# Patient Record
Sex: Female | Born: 1995 | Race: Black or African American | Hispanic: No | Marital: Single | Smoking: Never smoker
Health system: Southern US, Community
[De-identification: ages and names within clinical notes are randomized; demographics above are authoritative.]

## PROBLEM LIST (undated history)

## (undated) DIAGNOSIS — Z789 Other specified health status: Secondary | ICD-10-CM

## (undated) HISTORY — PX: NO PAST SURGERIES: SHX2092

---
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
## 2018-01-09 ENCOUNTER — Emergency Department (HOSPITAL_BASED_OUTPATIENT_CLINIC_OR_DEPARTMENT_OTHER): Payer: Medicaid Other

## 2018-01-09 ENCOUNTER — Other Ambulatory Visit: Payer: Self-pay

## 2018-01-09 ENCOUNTER — Emergency Department (HOSPITAL_BASED_OUTPATIENT_CLINIC_OR_DEPARTMENT_OTHER)
Admission: EM | Admit: 2018-01-09 | Discharge: 2018-01-09 | Disposition: A | Discharge status: Home / Self Care | Payer: Medicaid Other | Attending: Emergency Medicine | Admitting: Emergency Medicine

## 2018-01-09 ENCOUNTER — Encounter (HOSPITAL_BASED_OUTPATIENT_CLINIC_OR_DEPARTMENT_OTHER): Payer: Self-pay | Admitting: *Deleted

## 2018-01-09 DIAGNOSIS — Y939 Activity, unspecified: Secondary | ICD-10-CM | POA: Diagnosis not present

## 2018-01-09 DIAGNOSIS — Y999 Unspecified external cause status: Secondary | ICD-10-CM | POA: Insufficient documentation

## 2018-01-09 DIAGNOSIS — Z79899 Other long term (current) drug therapy: Secondary | ICD-10-CM | POA: Insufficient documentation

## 2018-01-09 DIAGNOSIS — S63601A Unspecified sprain of right thumb, initial encounter: Secondary | ICD-10-CM | POA: Insufficient documentation

## 2018-01-09 DIAGNOSIS — X501XXA Overexertion from prolonged static or awkward postures, initial encounter: Secondary | ICD-10-CM | POA: Diagnosis not present

## 2018-01-09 DIAGNOSIS — Y929 Unspecified place or not applicable: Secondary | ICD-10-CM | POA: Insufficient documentation

## 2018-01-09 DIAGNOSIS — S6991XA Unspecified injury of right wrist, hand and finger(s), initial encounter: Secondary | ICD-10-CM | POA: Diagnosis present

## 2018-01-09 NOTE — Discharge Instructions (Signed)
Please read and follow all provided instructions.  Your diagnoses today include:  1. Sprain of right thumb, unspecified site of finger, initial encounter    Tests performed today include:  An x-ray of the affected area - does NOT show any broken bones  Vital signs. See below for your results today.   Medications prescribed:   Ibuprofen (Motrin, Advil) - anti-inflammatory pain medication  Do not exceed  ibuprofen every 6 hours, take with food  You have been prescribed an anti-inflammatory medication or NSAID. Take with food. Take smallest effective dose for the shortest duration needed for your pain. Stop taking if you experience stomach pain or vomiting.   Take any prescribed medications only as directed.  Home care instructions:   Follow any educational materials contained in this packet  Follow R.I.C.E. Protocol:  R - rest your injury   I  - use ice on injury without applying directly to skin  C - compress injury with bandage or splint  E - elevate the injury as much as possible  Follow-up instructions: Please follow-up with your primary care provider or the provided orthopedic physician (bone specialist) if you continue to have significant pain in 1 week. In this case you may have a more severe injury that requires further care.   Return instructions:   Please return if your fingers are numb or tingling, appear gray or blue, or you have severe pain (also elevate the arm and loosen splint or wrap if you were given one)  Please return to the Emergency Department if you experience worsening symptoms.   Please return if you have any other emergent concerns.  Additional Information:  Your vital signs today were: BP 117/84 (BP Location: Left Arm)    Pulse 79    Temp 97.9 F (36.6 C) (Oral)    Resp 17    Ht  (1.702 m)    Wt 64.9 kg (143 lb)    LMP 12/26/2017    SpO2 100%    BMI 22.40 kg/m  If your blood pressure (BP) was elevated above 135/85 this visit,  please have this repeated by your doctor within one month. --------------

## 2018-01-09 NOTE — ED Provider Notes (Signed)
[facility] EMERGENCY DEPARTMENT Provider Note   CSN: [id number] Arrival date & time: 01/09/18  1623     History   Chief Complaint Chief Complaint  Patient presents with  . Finger Injury    HPI Kristina Ford is a 22 y.o. female.  Patient presents with acute onset of right thumb injury occurring yesterday when her niece fell onto her thumb causing it to bend in an awkward way.  Patient has had pain, decreased grip strength of the thumb since that time.  No numbness or tingling.  No treatments prior to arrival.  Pain is worse with movement and palpation.  No other injuries reported.     Past Medical History:  Diagnosis Date  . Medical history non-contributory     Patient Active Problem List   Diagnosis Date Noted  . Active labor at term 11/18/2014  . Decreased fetal movement   . [redacted] weeks gestation of pregnancy   . Fetal cardiac echogenic focus, antepartum 07/15/2014    Past Surgical History:  Procedure Laterality Date  . NO PAST SURGERIES       OB History    Gravida  2   Para  1   Term  1   Preterm      AB  1   Living  1     SAB  1   TAB      Ectopic      Multiple  0   Live Births  1            Home Medications    Prior to Admission medications   Medication Sig Start Date End Date Taking? Authorizing Provider  cyclobenzaprine (FLEXERIL) 10 MG tablet Take 1 tablet (10 mg total) by mouth at bedtime. 11/21/14   Bluford Main, MD  ibuprofen (ADVIL,MOTRIN) 600 MG tablet Take 1 tablet (600 mg total) by mouth every 6 (six) hours. 11/21/14   Bluford Main, MD  Prenatal Vit-Fe Fumarate-FA (PRENATAL COMPLETE) 14-0.4 MG TABS Take 1 tablet by mouth daily. 04/07/14   Trixie Dredge, PA-C    Family History No family history on file.  Social History Social History   Tobacco Use  . Smoking status: Never Smoker  . Smokeless tobacco: Never Used  Substance Use Topics  . Alcohol use: No  . Drug use: Yes    Types: Marijuana     Allergies     Patient has no known allergies.   Review of Systems Review of Systems  Constitutional: Negative for activity change.  Musculoskeletal: Positive for arthralgias. Negative for back pain, joint swelling and neck pain.  Skin: Negative for wound.  Neurological: Positive for weakness. Negative for numbness.     Physical Exam Updated Vital Signs BP 117/84 (BP Location: Left Arm)   Pulse 79   Temp 97.9 F (36.6 C) (Oral)   Resp 17   Ht  (1.702 m)   Wt 64.9 kg (143 lb)   LMP 12/26/2017   SpO2 100%   BMI 22.40 kg/m   Physical Exam  Constitutional: She appears well-developed and well-nourished.  HENT:  Head: Normocephalic and atraumatic.  Eyes: Pupils are equal, round, and reactive to light.  Neck: Normal range of motion. Neck supple.  Cardiovascular: Normal pulses. Exam reveals no decreased pulses.  Musculoskeletal: She exhibits tenderness. She exhibits no edema.       Right hand: She exhibits decreased range of motion and tenderness (Ulnar and radial). She exhibits normal capillary refill and no deformity.  Hands: Neurological: She is alert. No sensory deficit.  Motor, sensation, and vascular distal to the injury is fully intact.   Skin: Skin is warm and dry.  Psychiatric: She has a normal mood and affect.  Nursing note and vitals reviewed.    ED Treatments / Results  Labs (all labs ordered are listed, but only abnormal results are displayed) Labs Reviewed - No data to display  EKG None  Radiology Dg Finger Thumb Right  Result Date: 01/09/2018 CLINICAL DATA:  Rt Thumb pain entirely s/p bent backwards after a child fell on it x yesterday with limited ROM and grip. No old injury known. EXAM: RIGHT THUMB 2+V COMPARISON:  None. FINDINGS: There is no evidence of fracture or dislocation. There is no evidence of arthropathy or other focal bone abnormality. Soft tissues are unremarkable IMPRESSION: Negative. Electronically Signed   By: Norva Pavlov M.D.   On:  01/09/2018 17:28    Procedures Procedures (including critical care time)  Medications Ordered in ED Medications - No data to display   Initial Impression / Assessment and Plan / ED Course  I have reviewed the triage vital signs and the nursing notes.  Pertinent labs & imaging results that were available during my care of the patient were reviewed by me and considered in my medical decision making (see chart for details).     Patient seen and examined.  Reviewed x-ray results with patient.  Vital signs reviewed and are as follows: BP 117/84 (BP Location: Left Arm)   Pulse 79   Temp 97.9 F (36.6 C) (Oral)   Resp 17   Ht  (1.702 m)   Wt 64.9 kg (143 lb)   LMP 12/26/2017   SpO2 100%   BMI 22.40 kg/m   Will provide Velcro thumb spica.  Encouraged sports medicine follow-up if needed after trial of rice protocol.  Final Clinical Impressions(s) / ED Diagnoses   Final diagnoses:  Sprain of right thumb, unspecified site of finger, initial encounter   Patient with thumb sprain, possible UCL injury.  Imaging negative.  Treatment plan as above.  Thumb is neurovascularly intact.  ED Discharge Orders    None       Renne Crigler, Cordelia Poche 01/09/18 1830    Tegeler, Canary Brim, MD 01/10/18 [phone]

## 2018-01-09 NOTE — ED Triage Notes (Signed)
A child fell onto her right thumb yesterday causing it to move backward. Painful today.

## 2019-01-01 ENCOUNTER — Emergency Department (HOSPITAL_BASED_OUTPATIENT_CLINIC_OR_DEPARTMENT_OTHER)
Admission: EM | Admit: 2019-01-01 | Discharge: 2019-01-01 | Disposition: A | Discharge status: Home / Self Care | Payer: Medicaid Other | Attending: Emergency Medicine | Admitting: Emergency Medicine

## 2019-01-01 ENCOUNTER — Other Ambulatory Visit: Payer: Self-pay

## 2019-01-01 ENCOUNTER — Encounter (HOSPITAL_BASED_OUTPATIENT_CLINIC_OR_DEPARTMENT_OTHER): Payer: Self-pay | Admitting: Emergency Medicine

## 2019-01-01 DIAGNOSIS — Z79899 Other long term (current) drug therapy: Secondary | ICD-10-CM | POA: Insufficient documentation

## 2019-01-01 DIAGNOSIS — N898 Other specified noninflammatory disorders of vagina: Secondary | ICD-10-CM | POA: Diagnosis present

## 2019-01-01 DIAGNOSIS — A599 Trichomoniasis, unspecified: Secondary | ICD-10-CM

## 2019-01-01 DIAGNOSIS — N760 Acute vaginitis: Secondary | ICD-10-CM | POA: Insufficient documentation

## 2019-01-01 DIAGNOSIS — Z113 Encounter for screening for infections with a predominantly sexual mode of transmission: Secondary | ICD-10-CM | POA: Diagnosis not present

## 2019-01-01 DIAGNOSIS — B9689 Other specified bacterial agents as the cause of diseases classified elsewhere: Secondary | ICD-10-CM

## 2019-01-01 LAB — WET PREP, GENITAL
Sperm: NONE SEEN
Yeast Wet Prep HPF POC: NONE SEEN

## 2019-01-01 LAB — URINALYSIS, ROUTINE W REFLEX MICROSCOPIC
Bilirubin Urine: NEGATIVE
Glucose, UA: NEGATIVE mg/dL
Hgb urine dipstick: NEGATIVE
Ketones, ur: NEGATIVE mg/dL
Leukocytes,Ua: NEGATIVE
Nitrite: NEGATIVE
Protein, ur: NEGATIVE mg/dL
Specific Gravity, Urine: 1.02 (ref 1.005–1.030)
pH: 7 (ref 5.0–8.0)

## 2019-01-01 LAB — PREGNANCY, URINE: Preg Test, Ur: NEGATIVE

## 2019-01-01 MED ORDER — CEFTRIAXONE SODIUM 250 MG IJ SOLR
250.0000 mg | Freq: Once | INTRAMUSCULAR | Status: AC
  Administered 2019-01-01: 250 mg via INTRAMUSCULAR
  Filled 2019-01-01: qty 250

## 2019-01-01 MED ORDER — METRONIDAZOLE 500 MG PO TABS: 500.0000 mg | ORAL_TABLET | Freq: Two times a day (BID) | ORAL | 0 refills | Status: DC

## 2019-01-01 MED ORDER — AZITHROMYCIN 1 G PO PACK
1.0000 g | PACK | Freq: Once | ORAL | Status: AC
  Administered 2019-01-01: 06:00:00 1 g via ORAL
  Filled 2019-01-01: qty 1

## 2019-01-01 MED ORDER — METRONIDAZOLE 500 MG PO TABS
2000.0000 mg | ORAL_TABLET | Freq: Once | ORAL | Status: AC
  Administered 2019-01-01: 2000 mg via ORAL
  Filled 2019-01-01: qty 4

## 2019-01-01 MED ORDER — ONDANSETRON 4 MG PO TBDP
4.0000 mg | ORAL_TABLET | Freq: Once | ORAL | Status: AC
  Administered 2019-01-01: 06:00:00 4 mg via ORAL
  Filled 2019-01-01: qty 1

## 2019-01-01 NOTE — ED Provider Notes (Signed)
TIME SEEN: 5:24 AM  CHIEF COMPLAINT: Vaginal discharge  HPI: Patient is a G2, P1 with no significant past medical history who presents to the emergency department with a month of vaginal discharge.  She denies abdominal pain, fevers, cough, chest pain, shortness of breath, vomiting, diarrhea, dysuria, hematuria, vaginal bleeding.  Last menstrual period was 2 weeks ago.  Sexually active with one new female partner and they do not use protection.  She states she has never had an STD before.  She states that it is not abnormal for her to have vaginal discharge even since she was a teenager.  She is requesting STD testing today.  ROS: See HPI Constitutional: no fever  Eyes: no drainage  ENT: no runny nose   Cardiovascular:  no chest pain  Resp: no SOB  GI: no vomiting GU: no dysuria Integumentary: no rash  Allergy: no hives  Musculoskeletal: no leg swelling  Neurological: no slurred speech ROS otherwise negative  PAST MEDICAL HISTORY/PAST SURGICAL HISTORY:  Past Medical History:  Diagnosis Date  . Medical history non-contributory     MEDICATIONS:  Prior to Admission medications   Medication Sig Start Date End Date Taking? Authorizing Provider  cyclobenzaprine (FLEXERIL) 10 MG tablet Take 1 tablet (10 mg total) by mouth at bedtime. 11/21/14   Bluford Main, MD  ibuprofen (ADVIL,MOTRIN) 600 MG tablet Take 1 tablet (600 mg total) by mouth every 6 (six) hours. 11/21/14   Bluford Main, MD  Prenatal Vit-Fe Fumarate-FA (PRENATAL COMPLETE) 14-0.4 MG TABS Take 1 tablet by mouth daily. 04/07/14   Trixie Dredge, PA-C    ALLERGIES:  No Known Allergies  SOCIAL HISTORY:  Social History   Tobacco Use  . Smoking status: Never Smoker  . Smokeless tobacco: Never Used  Substance Use Topics  . Alcohol use: No    FAMILY HISTORY: No family history on file.  EXAM: BP 130/80 (BP Location: Right Arm)   Pulse 85   Temp 98.2 F (36.8 C) (Oral)   Resp 16   Ht 5\' 5"  (1.651 m)   Wt 65 kg   LMP  12/18/2018   SpO2 100%   BMI 23.85 kg/m  CONSTITUTIONAL: Alert and oriented and responds appropriately to questions. Well-appearing; well-nourished HEAD: Normocephalic EYES: Conjunctivae clear, pupils appear equal, EOMI ENT: normal nose; moist mucous membranes NECK: Normal range of motion CARD: RRR RESP: Normal chest excursion without splinting or tachypnea; no hypoxia or respiratory distress, speaking full sentences ABD/GI: Normal bowel sounds; non-distended; soft, non-tender, no rebound, no guarding, no peritoneal signs, no hepatosplenomegaly GU:  Normal external genitalia. No lesions, rashes noted. Patient has no vaginal bleeding on exam.  Minimal thick white vaginal discharge.  No adnexal tenderness, mass or fullness, no cervical motion tenderness. Cervix is not appear friable.  Cervix is closed.  Chaperone present for exam. BACK:  The back appears normal and is non-tender to palpation, there is no CVA tenderness EXT: Normal ROM in all joints; no major deformity noted SKIN: Normal color for age and race; warm; no rash on exposed skin NEURO: Moves all extremities equally, normal gait, normal speech PSYCH: The patient's mood and manner are appropriate. Grooming and personal hygiene are appropriate.  MEDICAL DECISION MAKING: Patient here with vaginal discharge.  Requesting STD screening.  Will send urinalysis, urine pregnancy test and obtain gonorrhea, chlamydia swabs as well as a wet prep.  Have offered HIV and syphilis testing which she declines.  Have recommended this be done as an outpatient.  Abdominal exam benign.  Pelvic exam benign.  Doubt torsion, PID, TOA, ovarian cyst, appendicitis.  ED PROGRESS: Urine shows no sign of infection.  Pregnancy test negative.  Offered empiric treatment for gonorrhea and chlamydia given patient presented with her partner who also is complaining of penile discharge.  She declines empiric treatment and would like to wait for her test results to come back.   She is aware that she needs to avoid sexual intercourse for at least 7 days after her partner has been treated and if she is positive she will need to be treated and wait an additional 7 days after treatment before intercourse.  Urine shows no sign of infection and her pregnancy test is negative.  Her wet prep is positive for Trichomonas as well as clue cells.  Have recommended that she be empirically treated today for gonorrhea, chlamydia as well as receive treatment for trichomonas and she will be discharged with Flagyl.  Have advised her to avoid sexual intercourse for at least a week after treatment has been completed.  She is very upset, tearful.  I have again offered her HIV and syphilis testing today which she declines.  We will give her outpatient health department follow-up information for further STD screening.  She verbalized understanding.   At this time, I do not feel there is any life-threatening condition present. I have reviewed and discussed all results (EKG, imaging, lab, urine as appropriate) and exam findings with patient/family. I have reviewed nursing notes and appropriate previous records.  I feel the patient is safe to be discharged home without further emergent workup and can continue workup as an outpatient as needed. Discussed usual and customary return precautions. Patient/family verbalize understanding and are comfortable with this plan.  Outpatient follow-up has been provided as needed. All questions have been answered.    Nicholaus Steinke, Layla MawKristen N, DO 01/01/19 [id number]

## 2019-01-01 NOTE — ED Triage Notes (Signed)
Patient co vaginal discharge; states seen at gyn for same 1 month ago.

## 2019-01-01 NOTE — Discharge Instructions (Addendum)
I strongly recommend that you follow-up with the health department for HIV and syphilis screening.  You have received treatment today for trichomonas, gonorrhea and chlamydia today.  Your trichomonas test was positive.  Your gonorrhea and Chlamydia tests are pending.  I recommend you avoid sexual intercourse for at least 1 week after your treatment has completed.  You were also diagnosed with bacterial vaginosis which can cause vaginal discharge.  This is not a sexually transmitted disease.  You need to take metronidazole (Flagyl) twice a day for 1 week.  Do not drink alcohol when taking this medication.   [place] [place] [facility]:  [facility] [employer] [facility] [employer] [address] [place], [place] [phone]  [facility] for [facility] at [facility] [address] [place], [place] [phone]  [facility] [address] [place], [place] [phone]   [facility] [address] [place] [address] [place], [place] [phone]  [facility] [address] [place], [place] [phone]   [facility] [address] [place] [address] [place], [place] [phone]   [facility] [address] [place], [place] [phone]   [facility] [address] [place], [place] [phone]   [facility] [address] [place], [place] [phone]

## 2019-01-02 LAB — GC/CHLAMYDIA PROBE AMP (~~LOC~~) NOT AT ARMC
Chlamydia: NEGATIVE
Neisseria Gonorrhea: NEGATIVE

## 2019-01-18 ENCOUNTER — Telehealth (HOSPITAL_BASED_OUTPATIENT_CLINIC_OR_DEPARTMENT_OTHER): Payer: Self-pay | Admitting: Emergency Medicine

## 2019-01-18 MED ORDER — METRONIDAZOLE 500 MG PO TABS: 500.0000 mg | ORAL_TABLET | Freq: Two times a day (BID) | ORAL | 0 refills | Status: DC

## 2019-01-18 NOTE — Telephone Encounter (Signed)
Patient states she lost her prescription for Flagyl and is requesting a replacement.

## 2020-07-08 ENCOUNTER — Emergency Department (HOSPITAL_BASED_OUTPATIENT_CLINIC_OR_DEPARTMENT_OTHER)
Admission: EM | Admit: 2020-07-08 | Discharge: 2020-07-08 | Disposition: A | Discharge status: Home / Self Care | Payer: Medicaid Other | Attending: Emergency Medicine | Admitting: Emergency Medicine

## 2020-07-08 ENCOUNTER — Encounter (HOSPITAL_BASED_OUTPATIENT_CLINIC_OR_DEPARTMENT_OTHER): Payer: Self-pay | Admitting: *Deleted

## 2020-07-08 ENCOUNTER — Emergency Department (HOSPITAL_BASED_OUTPATIENT_CLINIC_OR_DEPARTMENT_OTHER): Payer: Medicaid Other

## 2020-07-08 ENCOUNTER — Other Ambulatory Visit: Payer: Self-pay

## 2020-07-08 DIAGNOSIS — M542 Cervicalgia: Secondary | ICD-10-CM | POA: Diagnosis not present

## 2020-07-08 DIAGNOSIS — S01512A Laceration without foreign body of oral cavity, initial encounter: Secondary | ICD-10-CM | POA: Insufficient documentation

## 2020-07-08 DIAGNOSIS — S0081XA Abrasion of other part of head, initial encounter: Secondary | ICD-10-CM

## 2020-07-08 DIAGNOSIS — Y92481 Parking lot as the place of occurrence of the external cause: Secondary | ICD-10-CM | POA: Diagnosis not present

## 2020-07-08 DIAGNOSIS — M7918 Myalgia, other site: Secondary | ICD-10-CM

## 2020-07-08 DIAGNOSIS — M545 Low back pain, unspecified: Secondary | ICD-10-CM | POA: Insufficient documentation

## 2020-07-08 DIAGNOSIS — S0990XA Unspecified injury of head, initial encounter: Secondary | ICD-10-CM | POA: Diagnosis present

## 2020-07-08 LAB — PREGNANCY, URINE: Preg Test, Ur: NEGATIVE

## 2020-07-08 MED ORDER — METHOCARBAMOL 500 MG PO TABS: 1000.0000 mg | ORAL_TABLET | Freq: Four times a day (QID) | ORAL | 0 refills | Status: AC

## 2020-07-08 MED ORDER — ONDANSETRON 4 MG PO TBDP
4.0000 mg | ORAL_TABLET | Freq: Once | ORAL | Status: AC
  Administered 2020-07-08: 4 mg via ORAL

## 2020-07-08 MED ORDER — IBUPROFEN 400 MG PO TABS
600.0000 mg | ORAL_TABLET | Freq: Once | ORAL | Status: AC
  Administered 2020-07-08: 600 mg via ORAL
  Filled 2020-07-08: qty 1

## 2020-07-08 MED ORDER — ONDANSETRON HCL 4 MG PO TABS
4.0000 mg | ORAL_TABLET | Freq: Once | ORAL | Status: DC
  Filled 2020-07-08: qty 1

## 2020-07-08 MED ORDER — ONDANSETRON HCL 4 MG PO TABS: 4.0000 mg | ORAL_TABLET | Freq: Four times a day (QID) | ORAL | 0 refills | Status: AC

## 2020-07-08 MED ORDER — NAPROXEN 500 MG PO TABS: 500.0000 mg | ORAL_TABLET | Freq: Two times a day (BID) | ORAL | 0 refills | Status: AC

## 2020-07-08 NOTE — ED Notes (Signed)
Explained f/u info to pt friend, verbalized understanding of where to get dc meds. Vitals WNL, assisted pt out of ED via w/c, A&OX4. Left ED w/o issue.

## 2020-07-08 NOTE — ED Notes (Signed)
Patient transported to X-ray 
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
## 2021-10-18 ENCOUNTER — Encounter: Payer: Medicaid Other | Admitting: Obstetrics and Gynecology
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
## 2022-10-31 ENCOUNTER — Encounter: Payer: Self-pay | Admitting: General Practice

## 2022-11-15 ENCOUNTER — Ambulatory Visit: Payer: Medicaid Other | Admitting: Family Medicine

## 2022-11-23 ENCOUNTER — Encounter: Payer: Medicaid Other | Admitting: Obstetrics and Gynecology

## 2023-06-18 ENCOUNTER — Ambulatory Visit: Payer: Medicaid Other

## 2023-06-18 ENCOUNTER — Other Ambulatory Visit (HOSPITAL_COMMUNITY)
Admission: RE | Admit: 2023-06-18 | Discharge: 2023-06-18 | Disposition: A | Discharge status: Home / Self Care | Payer: Medicaid Other | Source: Ambulatory Visit

## 2023-06-18 VITALS — BP 103/65 | HR 66 | Ht 67.0 in | Wt 123.0 lb

## 2023-06-18 DIAGNOSIS — Z1331 Encounter for screening for depression: Secondary | ICD-10-CM

## 2023-06-18 DIAGNOSIS — Z1339 Encounter for screening examination for other mental health and behavioral disorders: Secondary | ICD-10-CM

## 2023-06-18 DIAGNOSIS — Z124 Encounter for screening for malignant neoplasm of cervix: Secondary | ICD-10-CM | POA: Diagnosis not present

## 2023-06-18 DIAGNOSIS — Z30432 Encounter for removal of intrauterine contraceptive device: Secondary | ICD-10-CM | POA: Diagnosis not present

## 2023-06-18 DIAGNOSIS — Z113 Encounter for screening for infections with a predominantly sexual mode of transmission: Secondary | ICD-10-CM | POA: Insufficient documentation

## 2023-06-18 DIAGNOSIS — Z01419 Encounter for gynecological examination (general) (routine) without abnormal findings: Secondary | ICD-10-CM | POA: Insufficient documentation

## 2023-06-18 DIAGNOSIS — Z1239 Encounter for other screening for malignant neoplasm of breast: Secondary | ICD-10-CM | POA: Diagnosis not present

## 2023-06-18 DIAGNOSIS — B9689 Other specified bacterial agents as the cause of diseases classified elsewhere: Secondary | ICD-10-CM

## 2023-06-18 DIAGNOSIS — N760 Acute vaginitis: Secondary | ICD-10-CM
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
## 2023-06-18 NOTE — Progress Notes (Signed)
    GYNECOLOGY OFFICE PROCEDURE NOTE  Kristina Ford is a 27 y.o. G2P1011 here for Mirena IUD removal. No GYN concerns.  Last pap smear was on completed today and pending.  IUD Removal  Patient identified, informed consent performed, consent signed.  Patient was in the dorsal lithotomy position, normal external genitalia was noted.  A speculum was placed in the patient's vagina, normal discharge was noted, no lesions. The cervix was visualized, no lesions, no abnormal discharge.  The strings of the IUD were grasped and pulled using ring forceps. The IUD was removed in its entirety. Patient tolerated the procedure well.    Patient will use no method for contraception.  Routine preventative health maintenance measures emphasized.   Cherre Robins MSN, CNM Advanced Practice Provider, Center for [employer]

## 2023-06-20 LAB — CYTOLOGY - PAP
Chlamydia: NEGATIVE
Comment: NEGATIVE
Comment: NEGATIVE
Comment: NORMAL
Diagnosis: NEGATIVE
Neisseria Gonorrhea: NEGATIVE
Trichomonas: NEGATIVE

## 2023-06-21 MED ORDER — METRONIDAZOLE 500 MG PO TABS: 500.0000 mg | ORAL_TABLET | Freq: Two times a day (BID) | ORAL | 0 refills | Status: AC

## 2023-06-21 NOTE — Addendum Note (Signed)
Addended by: Gerrit Heck L on: 06/21/2023 08:33 AM   Modules accepted: Orders

## 2023-06-27 ENCOUNTER — Telehealth: Payer: Self-pay | Admitting: Clinical

## 2023-06-27 NOTE — Telephone Encounter (Signed)
Attempt call regarding referral; Unable to leave voice message as no voicemail set up.
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
## 2023-07-23 ENCOUNTER — Ambulatory Visit (INDEPENDENT_AMBULATORY_CARE_PROVIDER_SITE_OTHER): Payer: Medicaid Other | Admitting: Clinical

## 2023-07-23 DIAGNOSIS — F819 Developmental disorder of scholastic skills, unspecified: Secondary | ICD-10-CM

## 2023-07-23 DIAGNOSIS — F39 Unspecified mood [affective] disorder: Secondary | ICD-10-CM

## 2023-07-24 NOTE — Patient Instructions (Addendum)
Center for [facility] at [facility] [address] [place], [place] [address] [phone] (main office) [phone] [facility] office)  [facility] [address] [place], [place]  [address]  ADHD testing Learning disorder testing Psychological evaluations  [email or web address] [phone]  Housing Resources                    [address] [place] (serves [place], [place], [place], [place], [place], [place], [place], [place], [place], [place], [place], [place], and [place] counties) [address] [place], [place] [address] [phone] DeveloperU.ch  **Rental assistance, Home Rehabilitation,Weatherization Assistance Program, Chief Financial Officer, Housing Voucher Program  [employer] for Housing and [employer] Studies: Proofreader Resources to residents of [place], [place], and [place] Make sure you have your documents ready, including:  (Household income verification: 2 months pay stubs, unemployment/social security award letter, statement of no income for all household members over 83) Photo ID for all household members over 18 Utility Bill/Rent Ledger/Lease: must show past due amount for utilities/rent, or the rental agreement if rent is current 2. Start your application online or by paper (in [place] or [place]) at:     [email or web address]  3. Once you have completed the online application, you will get an email confirmation message from the county. Expect to hear back by phone or email at least 6-10 weeks from submitting your application.  4. While you wait:  Call [phone] to check in on your application Let your landlord know that you've applied. Your landlord will be asked to submit documents (W-9) during this application process. Payments will be made directly to the landlord/property  management company and utility company Rent or utility assistance for [employer], [place], and [place] Apply at [email or web address] Questions? Call or email Kristina Ford at [phone] or [email or web address]   Eviction Mediation Program: The HOPE Program [email or web address] HOPE Progam serves low-income renters in [address] [place] counties, defined as less than or equal to 80% of the area median income for the county where the renter lives. In the following 12 counties, you should apply to your local rent and utility assistance program INSTEAD OF the HOPE Program: [place], [place], [place], [place], [place], [place], [place], [place], [place], [place], [place], [place]  If you live outside of [place], contact HOPE call center at [phone] to talk to a Program Representative Monday-Friday, 8am-5pm Note that Native American tribes also received federal funding for rent and utility assistance programs. Recognized members of the following tribes will be served by programs managed by tribal governments, including: [place], [place], [place], [place] of [place] and [facility] Eviction Mediation Coordinator, [place], [phone] [email or web address] .[employer], [place], [phone] [email or web address]   Housing Resources [facility] [place] [address], [place], [place] [address] [phone] www.gha-[email or web address].[email or web address] [address] [place], [place] [address] [phone] PhoneCaptions.ch **Programs include: Hospital doctor and Housing Counseling, Healthy Radiographer, therapeutic, Homeless Prevention and Housing Assistance  Government [facility] [address], [place], [place] [address] [phone] [email or web address].[email or web address] **housing applications/recertification; tax  payment relief/exemption under specific qualifications  [facility] [address] [place], [place] [address] [phone] [email or web address]  **emergency and transitional housing, rent/mortgage assistance, utility assistance  [facility] [address] [place], [place] [address] [phone] [email or web address] **emergency and transitional housing  [employer] for [employer] [address], [place], [place]  [address] [phone] [email or web address]

## 2023-08-12 NOTE — BH Specialist Note (Unsigned)
Integrated Behavioral Health via Telemedicine Visit  08/12/2023 Eriyana Hanauer [id number]  Number of Integrated Behavioral Health Clinician visits: 1- Initial Visit  Session Start time: 1050   Session End time: 1219  Total time in minutes: 89   Referring Provider: *** Patient/Family location: [facility] Provider location: *** All persons participating in visit: *** Types of Service: {CHL AMB TYPE OF SERVICE:[phone]}  I connected with [name] and/or Fatimah Lounsbury's {family members:20773} via  Telephone or Engineer, civil (consulting)  (Video is Surveyor, mining) and verified that I am speaking with the correct person using two identifiers. Discussed confidentiality: {YES/NO:21197}  I discussed the limitations of telemedicine and the availability of in person appointments.  Discussed there is a possibility of technology failure and discussed alternative modes of communication if that failure occurs.  I discussed that engaging in this telemedicine visit, they consent to the provision of behavioral healthcare and the services will be billed under their insurance.  Patient and/or legal guardian expressed understanding and consented to Telemedicine visit: {YES/NO:21197}  Presenting Concerns: Patient and/or family reports the following symptoms/concerns: *** Duration of problem: ***; Severity of problem: {Mild/Moderate/Severe:20260}  Patient and/or Family's Strengths/Protective Factors: {CHL AMB BH PROTECTIVE FACTORS:[phone]}  Goals Addressed: Patient will:  Reduce symptoms of: {IBH Symptoms:21014056}   Increase knowledge and/or ability of: {IBH Patient Tools:21014057}   Demonstrate ability to: {IBH Goals:21014053}  Progress towards Goals: {CHL AMB BH PROGRESS TOWARDS GOALS:[phone]}  Interventions: Interventions utilized:  {IBH Interventions:21014054} Standardized Assessments completed: {IBH Screening Tools:21014051}  Patient and/or Family Response:  ***  Assessment: Patient currently experiencing ***.   Patient may benefit from ***.  Plan: Follow up with behavioral health clinician on : *** Behavioral recommendations: *** Referral(s): {IBH Referrals:21014055}  I discussed the assessment and treatment plan with the patient and/or parent/guardian. They were provided an opportunity to ask questions and all were answered. They agreed with the plan and demonstrated an understanding of the instructions.   They were advised to call back or seek an in-person evaluation if the symptoms worsen or if the condition fails to improve as anticipated.  Valetta Close Shabrea Weldin, LCSW

## 2023-08-26 ENCOUNTER — Encounter (HOSPITAL_COMMUNITY): Payer: Self-pay

## 2023-08-26 ENCOUNTER — Ambulatory Visit: Payer: Medicaid Other | Admitting: Clinical
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
## 2023-08-28 ENCOUNTER — Encounter (HOSPITAL_COMMUNITY): Payer: Self-pay

## 2023-08-28 ENCOUNTER — Ambulatory Visit (HOSPITAL_COMMUNITY): Payer: Medicaid Other | Admitting: Psychiatry
